# Patient Record
Sex: Male | Born: 1940 | Race: White | Hispanic: No | Smoking: Former smoker
Health system: Southern US, Community
[De-identification: ages and names within clinical notes are randomized; demographics above are authoritative.]

## PROBLEM LIST (undated history)

## (undated) DIAGNOSIS — Z950 Presence of cardiac pacemaker: Secondary | ICD-10-CM

## (undated) DIAGNOSIS — C801 Malignant (primary) neoplasm, unspecified: Secondary | ICD-10-CM

## (undated) DIAGNOSIS — K219 Gastro-esophageal reflux disease without esophagitis: Secondary | ICD-10-CM

---

## 2008-07-06 DIAGNOSIS — C801 Malignant (primary) neoplasm, unspecified: Secondary | ICD-10-CM

## 2008-07-06 HISTORY — DX: Malignant (primary) neoplasm, unspecified: C80.1

## 2009-11-01 ENCOUNTER — Ambulatory Visit: Admission: RE | Admit: 2009-11-01 | Discharge: 2010-01-01 | Payer: Self-pay | Admitting: Radiation Oncology

## 2009-11-08 ENCOUNTER — Ambulatory Visit (HOSPITAL_COMMUNITY): Admission: RE | Admit: 2009-11-08 | Discharge: 2009-11-08 | Payer: Self-pay | Admitting: Radiation Oncology

## 2009-11-13 ENCOUNTER — Encounter: Admission: RE | Admit: 2009-11-13 | Discharge: 2009-11-13 | Payer: Self-pay | Admitting: Dentistry

## 2009-11-13 ENCOUNTER — Ambulatory Visit: Payer: Self-pay | Admitting: Dentistry

## 2010-08-14 ENCOUNTER — Ambulatory Visit: Payer: Medicare HMO | Attending: Radiation Oncology | Admitting: Radiation Oncology

## 2011-03-24 IMAGING — CT CT NECK W/ CM
2 series · 10 of 14 positions shown, 12 images · IV contrast (agent unspecified)
Comparison: CT and Report from previous CT scan Nahum Roca
August 2009.

CLINICAL DATA: Biopsy-proven carcinoma of the left vocal cord.
Staging for XRT.

CT NECK WITH CONTRAST
TECHNIQUE: Multidetector CT imaging of the neck was performed with
intravenous contrast.
Contrast: 100 ml [id number].

[Series 2: neck rtn st · axial · 0.39mm/px · z∈[-282,-178]mm · 6 of 49 slices shown, 8 images (1 of 2)]
[im 7/49  soft-tissue]
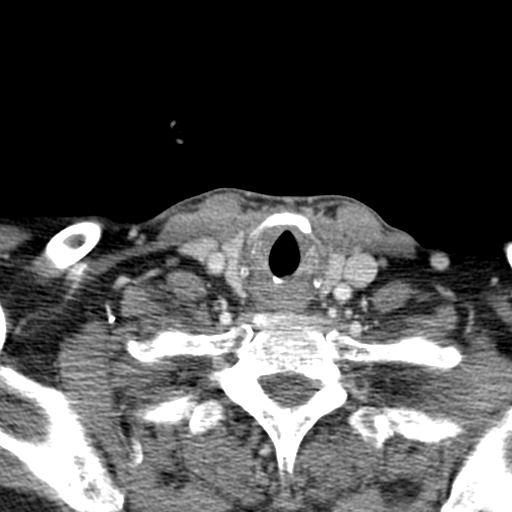
[im 7/49  bone]
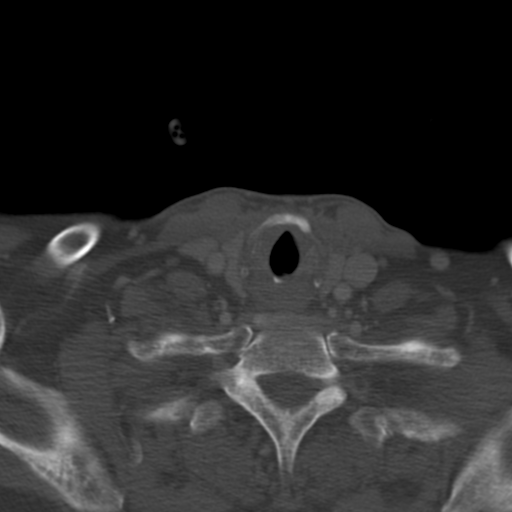
[im 14/49  bone]
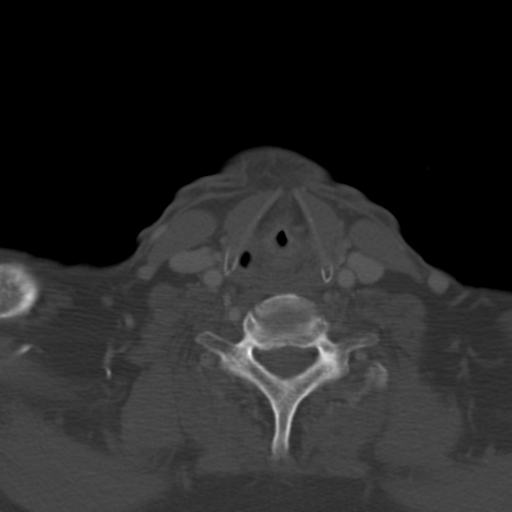
[im 21/49  bone]
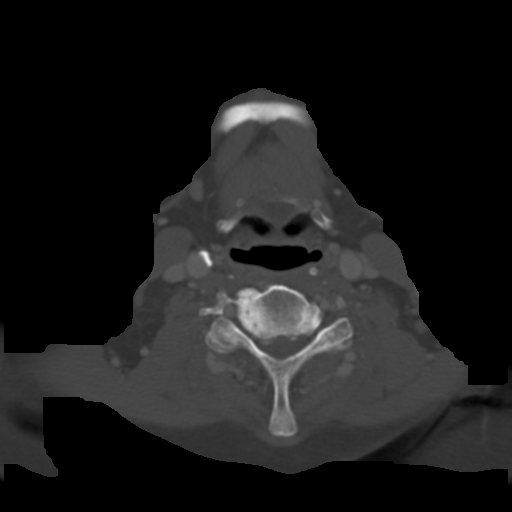
[im 28/49  bone]
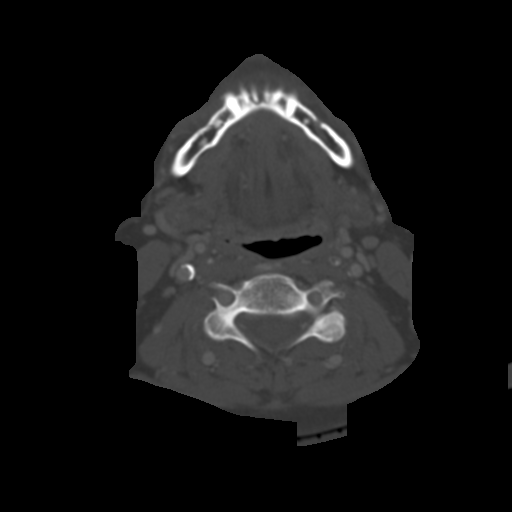
[im 35/49  soft-tissue]
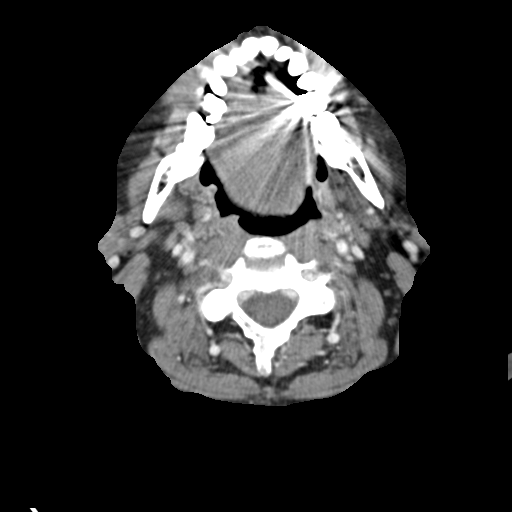
[im 35/49  bone]
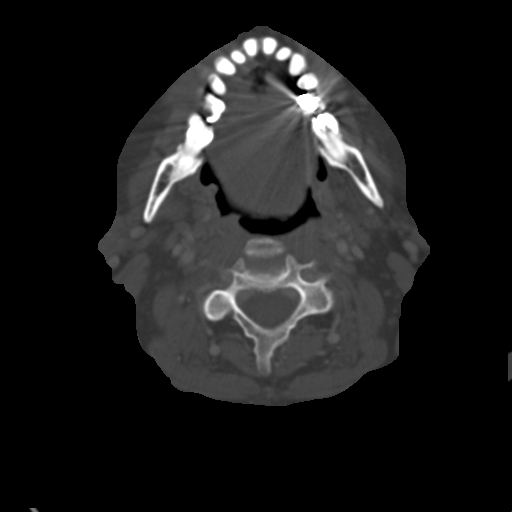
[im 42/49  bone]
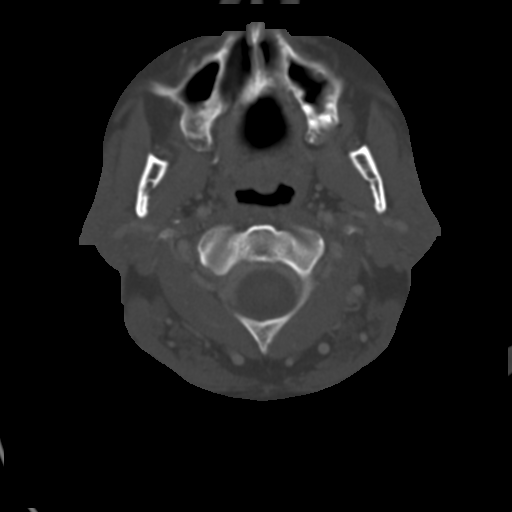

[Series 4: neck rtn st · axial · 0.39mm/px · z∈[-338,-272]mm · 4 of 38 slices shown (2 of 2)]
[im 8/38  bone]
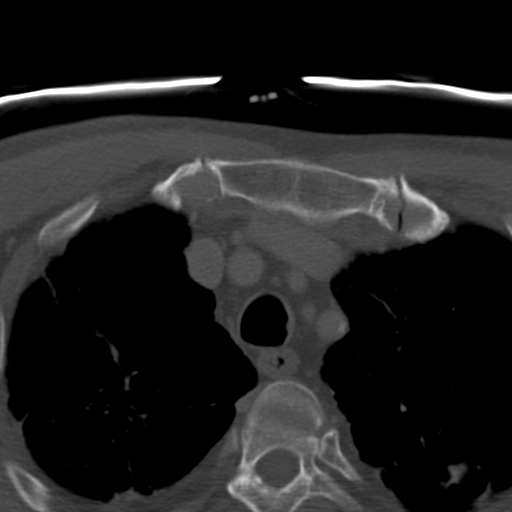
[im 15/38  bone]
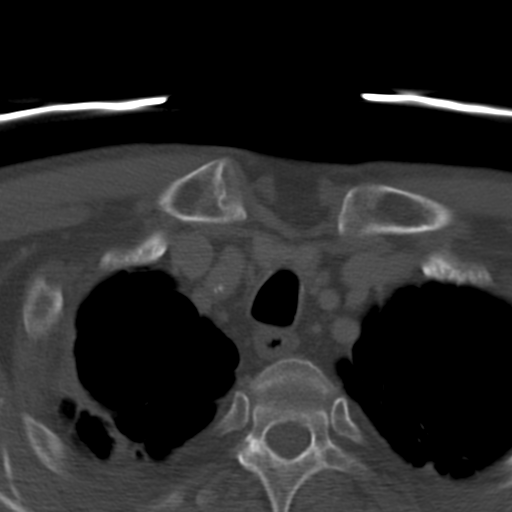
[im 23/38  bone]
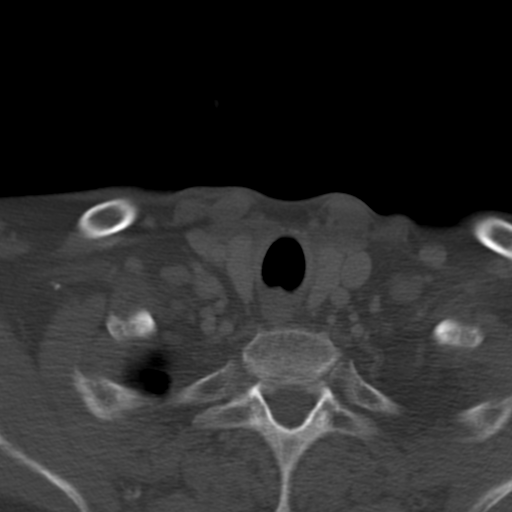
[im 30/38  bone]
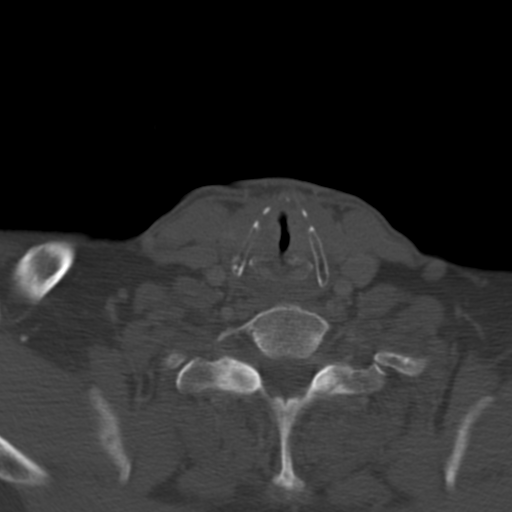

[10 of 14 positions shown; findings below may reference images not displayed]

FINDINGS: Suprahyoid neck:  No masses.  Normal parotid and submandibular
glands.

Larynx:  There is mild fullness and enhancement in the left true
cord as compared to the right. There is no visible involvement of
the anterior commissure.  Thyroid cartilage is intact. Subglottic
region free of disease.

The left arytenoid is sclerotic as compared to the right which
represents an interval change from prior outside CT.  While this
may simply represent paraspinous from inflammation, I cannot
exclude cartilage invasion.  MRI without with contrast may provide
additional information.  PET scan is unlikely to be a wide
sufficient spatial resolution in this area to answer the question
of cartilage invasion.

Infrahyoid neck:  No visible mass.

Lymph nodes:  No pathologically enlarged lymph nodes.

Upper chest/mediastinum:  Biapical bullae, incompletely evaluated.
Mild pleuroparenchymal thickening.

Additional:  Advanced cervical spondylosis, worst at C5-6.
IMPRESSION: Mild left vocal cord fullness with interval change in the
appearance of the left arytenoid which is now sclerotic; posterior
tumor invasion not excluded.  Consider MRI without and with
contrast of the neck for further evaluation.

## 2014-01-23 ENCOUNTER — Inpatient Hospital Stay (HOSPITAL_COMMUNITY): Payer: Medicare HMO | Admitting: Certified Registered Nurse Anesthetist

## 2014-01-23 ENCOUNTER — Observation Stay (HOSPITAL_COMMUNITY)
Admission: AD | Admit: 2014-01-23 | Discharge: 2014-01-24 | Disposition: A | Discharge status: Home / Self Care | Payer: Medicare HMO | Source: Ambulatory Visit | Attending: Cardiology | Admitting: Cardiology

## 2014-01-23 ENCOUNTER — Encounter (HOSPITAL_COMMUNITY): Payer: Self-pay | Admitting: *Deleted

## 2014-01-23 ENCOUNTER — Encounter (HOSPITAL_COMMUNITY)
Admission: AD | Disposition: A | Discharge status: Home / Self Care | Payer: Self-pay | Source: Ambulatory Visit | Attending: Cardiology

## 2014-01-23 ENCOUNTER — Encounter (HOSPITAL_COMMUNITY): Payer: Medicare HMO | Admitting: Certified Registered Nurse Anesthetist

## 2014-01-23 DIAGNOSIS — I079 Rheumatic tricuspid valve disease, unspecified: Secondary | ICD-10-CM | POA: Diagnosis not present

## 2014-01-23 DIAGNOSIS — R5381 Other malaise: Secondary | ICD-10-CM | POA: Insufficient documentation

## 2014-01-23 DIAGNOSIS — I959 Hypotension, unspecified: Secondary | ICD-10-CM | POA: Diagnosis not present

## 2014-01-23 DIAGNOSIS — D7589 Other specified diseases of blood and blood-forming organs: Secondary | ICD-10-CM | POA: Diagnosis not present

## 2014-01-23 DIAGNOSIS — Z8521 Personal history of malignant neoplasm of larynx: Secondary | ICD-10-CM | POA: Diagnosis not present

## 2014-01-23 DIAGNOSIS — I059 Rheumatic mitral valve disease, unspecified: Secondary | ICD-10-CM | POA: Diagnosis not present

## 2014-01-23 DIAGNOSIS — Z923 Personal history of irradiation: Secondary | ICD-10-CM | POA: Insufficient documentation

## 2014-01-23 DIAGNOSIS — R5383 Other fatigue: Secondary | ICD-10-CM | POA: Diagnosis not present

## 2014-01-23 DIAGNOSIS — R911 Solitary pulmonary nodule: Secondary | ICD-10-CM | POA: Diagnosis not present

## 2014-01-23 DIAGNOSIS — Z87891 Personal history of nicotine dependence: Secondary | ICD-10-CM | POA: Insufficient documentation

## 2014-01-23 DIAGNOSIS — K219 Gastro-esophageal reflux disease without esophagitis: Secondary | ICD-10-CM | POA: Insufficient documentation

## 2014-01-23 DIAGNOSIS — I4891 Unspecified atrial fibrillation: Secondary | ICD-10-CM | POA: Diagnosis not present

## 2014-01-23 HISTORY — DX: Gastro-esophageal reflux disease without esophagitis: K21.9

## 2014-01-23 HISTORY — PX: CARDIOVERSION: SHX1299

## 2014-01-23 HISTORY — DX: Malignant (primary) neoplasm, unspecified: C80.1

## 2014-01-23 HISTORY — DX: Presence of cardiac pacemaker: Z95.0

## 2014-01-23 LAB — BASIC METABOLIC PANEL
Anion gap: 11 (ref 5–15)
BUN: 26 mg/dL — ABNORMAL HIGH (ref 6–23)
CO2: 24 mEq/L (ref 19–32)
Calcium: 8 mg/dL — ABNORMAL LOW (ref 8.4–10.5)
Chloride: 101 mEq/L (ref 96–112)
Creatinine, Ser: 1 mg/dL (ref 0.50–1.35)
GFR calc Af Amer: 85 mL/min — ABNORMAL LOW (ref >90)
GFR calc non Af Amer: 73 mL/min — ABNORMAL LOW (ref >90)
GLUCOSE: 91 mg/dL (ref 70–99)
POTASSIUM: 4.8 meq/L (ref 3.7–5.3)
Sodium: 136 mEq/L — ABNORMAL LOW (ref 137–147)

## 2014-01-23 LAB — MRSA PCR SCREENING: MRSA by PCR: NEGATIVE

## 2014-01-23 LAB — CBC WITH DIFFERENTIAL/PLATELET
Basophils Absolute: 0 10*3/uL (ref 0.0–0.1)
Basophils Relative: 0 % (ref 0–1)
EOS ABS: 0 10*3/uL (ref 0.0–0.7)
Eosinophils Relative: 0 % (ref 0–5)
HEMATOCRIT: 28.4 % — AB (ref 39.0–52.0)
HEMOGLOBIN: 9.2 g/dL — AB (ref 13.0–17.0)
LYMPHS ABS: 0.6 10*3/uL — AB (ref 0.7–4.0)
Lymphocytes Relative: 6 % — ABNORMAL LOW (ref 12–46)
MCH: 31.3 pg (ref 26.0–34.0)
MCHC: 32.4 g/dL (ref 30.0–36.0)
MCV: 96.6 fL (ref 78.0–100.0)
MONOS PCT: 7 % (ref 3–12)
Monocytes Absolute: 0.8 10*3/uL (ref 0.1–1.0)
Neutro Abs: 9.5 10*3/uL — ABNORMAL HIGH (ref 1.7–7.7)
Neutrophils Relative %: 87 % — ABNORMAL HIGH (ref 43–77)
Platelets: 289 10*3/uL (ref 150–400)
RBC: 2.94 MIL/uL — ABNORMAL LOW (ref 4.22–5.81)
RDW: 16.8 % — ABNORMAL HIGH (ref 11.5–15.5)
WBC: 11 10*3/uL — ABNORMAL HIGH (ref 4.0–10.5)

## 2014-01-23 LAB — TSH: TSH: 2.19 u[IU]/mL (ref 0.350–4.500)

## 2014-01-23 SURGERY — CARDIOVERSION
Anesthesia: Monitor Anesthesia Care

## 2014-01-23 MED ORDER — SODIUM CHLORIDE 0.9 % IV SOLN
INTRAVENOUS | Status: DC | PRN
  Administered 2014-01-23: 18:00:00 via INTRAVENOUS

## 2014-01-23 MED ORDER — SODIUM CHLORIDE 0.9 % IV SOLN: 250.0000 mL | INTRAVENOUS | Status: DC

## 2014-01-23 MED ORDER — RIVAROXABAN 20 MG PO TABS
20.0000 mg | ORAL_TABLET | Freq: Every day | ORAL | Status: DC
  Administered 2014-01-23: 20 mg via ORAL
  Filled 2014-01-23 (×2): qty 1

## 2014-01-23 MED ORDER — SODIUM CHLORIDE 0.9 % IJ SOLN: 3.0000 mL | Freq: Two times a day (BID) | INTRAMUSCULAR | Status: DC

## 2014-01-23 MED ORDER — PHENYLEPHRINE HCL 10 MG/ML IJ SOLN
INTRAMUSCULAR | Status: DC | PRN
  Administered 2014-01-23: 80 ug via INTRAVENOUS

## 2014-01-23 MED ORDER — SODIUM CHLORIDE 0.9 % IJ SOLN: 3.0000 mL | INTRAMUSCULAR | Status: DC | PRN

## 2014-01-23 MED ORDER — PROPOFOL 10 MG/ML IV BOLUS
INTRAVENOUS | Status: DC | PRN
  Administered 2014-01-23: 50 mg via INTRAVENOUS

## 2014-01-23 MED ORDER — LIDOCAINE HCL (CARDIAC) 20 MG/ML IV SOLN
INTRAVENOUS | Status: DC | PRN
Start: 2014-01-23 — End: 2014-01-23
  Administered 2014-01-23: 60 mg via INTRAVENOUS

## 2014-01-23 NOTE — Transfer of Care (Signed)
Immediate Anesthesia Transfer of Care Note  Patient: Adrian Wade  Procedure(s) Performed: Procedure(s): CARDIOVERSION (N/A)  Patient Location: 2H20 Report to Sol RN  Anesthesia Type:MAC  Level of Consciousness: awake, alert , oriented and patient cooperative  Airway & Oxygen Therapy: Patient Spontanous Breathing and Patient connected to nasal cannula oxygen  Post-op Assessment: Report given to PACU RN, Post -op Vital signs reviewed and stable and Patient moving all extremities X 4  Post vital signs: Reviewed and stable  Complications: No apparent anesthesia complications

## 2014-01-23 NOTE — H&P (Addendum)
Adrian Wade is an 73 y.o. male.   Chief Complaint: Fatigue and dyspnea HPI: Mr. Adrian Wade is a 73 year old Caucasian male presents for the evaluation of  atrial fibrillation.  Patient states that he is feeling fatigue and tired since February 2015.  He felt like he has no energy and some days are worse than others.  He went to primary care provider for fatigue a week ago and was diagnosed with A. fib. with RVR. Patient was started on metoprolol, Diltiazem and Xarelto in May of 2015. He was seen on our office and I am admitting him directly to the hospital due to A. FIb with RVR   I had performed an echocardiogram, stress test and also nocturnal oximetry due to marked fatigue.  He also were event monitor to confirm presence of atrial fibrillation, he also recently has had CT scan of the chest and  2 days ago he was told to have a lung lesion that appears to be suspicious for malignancy, patient is being treated with antibiotics for the same.  Patient has had a laryngeal cancer in 2010.  Patient is in remission status po radiation.  Present had a history of smoking, one pack per day for 50 years, quit in 2009. No recent weight changes. No bleeding diathesis, no bowel or bladder disturbances.  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 CHA2DS2-VASc Score is 1 with yearly risk of stroke of 1.3 . Bleeding risk is 1.02-1.5 % per year.  Labs 01/01/2014: CMP was normal except for decreased serum albumin at 2.9. CBC was within normal limits, mild macrocytosis was evident. MCV 101. Vitamin B12 274, normal. TSH normal in January 2015. Malaise and fatigue (780.79). Nocturnal oximetry 01/19/2014: SPO2 less than 88% 495 minutes, less than 89% for 95 minutes. Lowest SPO2 71%. Basal SPO2 81%. Total desaturation events 49. Markedly abnormal nocturnal oximetry. Patient will be referred for sleep study. 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 Social History:  Marital status. Divorced. Alcohol Use. Occasional alcohol use, Drinks beer. Current tobacco use. Former smoker. quit in 2009, smoked about 1 ppd Number of Children. 1.  Allergies: NKDA  No prescriptions prior to admission      Review of Systems - General:Present- Fatigue. Not Present- Fever and Night Sweats. Skin:Not Present- Itching and Rash. HEENT:Not Present- Headache. Respiratory:Present- Chronic Cough. Not Present- Bloody sputum, Wheezing and Wakes up from Sleep Wheezing or Short of Breath. Cardiovascular:Not Present- Claudications, Fainting, Orthopnea and Swelling of Extremities. Gastrointestinal:Not Present- Abdominal Pain, Constipation, Diarrhea, Nausea and Vomiting. Musculoskeletal:Present- Backache (chronic and stable). Not Present- Joint Swelling. Neurological:Not Present- Headaches. Hematology:Not Present- Blood Clots, Easy Bruising and Nose Bleed.  Blood pressure 83/57, pulse 149, temperature 97.7 F (36.5 C), temperature source Oral, resp. rate 27, height 5' 10.5" (1.791 m), weight 57.8 kg (127 lb 6.8 oz), SpO2 92.00%. GENERAL: Well built and petite body habitus, who is in no acute distress. Alert and oriented x 3. Appears stated age.   HEENT: normal limits. No JVD.   CARDIAC EXAM: S1 variable and irregular, S2 normal, tachycardia present. No murmur or gallop.   CHEST EXAM: No tenderness of chest wall. LUNGS: Clear to percuss and auscultate. No rales or ronchi.   ABDOMEN: No hepatosplenomegaly. BS normal in all 4 quadrants. Abdomen is non-tender.   EXTREMITY: Full range of movementes, No edema. fUNGAL INFECTION OF ALL THE TOE NAILS NOTED.  NEUROLOGIC  EXAM: Grossly intact without any focal deficits. Alert O x 3.   VASCULAR EXAM: No skin breakdown. Carotids normal. Extremities: Femoral pulse normal. Popliteal pulse normal ;  Pedal pulse DP left 1 plus, otherwise normal vascular exam. No prominent pulse felt in the abdomen. No varicose veins.   EKG: atrial fibrillation with rapid ventricular response at the rate of 140-170 bpm.  No ischemia..   Assessment/Plan 1. Paroxysmal atrial fibrillation   CHA2DS2-VASc Score is 1 with yearly risk of stroke of 1.3 . Bleeding risk is 1.02-1.5 % per year.  Out Patient studies   Exercise myoview stress 12/08/2013: 1. Resting EKG showed normal sinus rhythm, poor R wave progression. Stress EKG was negative for ischemia. Patient exercised on BRUCE PROTOCOL for 3 minutes 0 seconds. The maximum work level achieved was 4.9 MET's. Accelerated HR response suggests aerobic intolerance. Hypotensive BP response. The baseline blood pressure was 122/74 mmHg and 80/50 mmHg with exercise. The test was terminated due to achievement of the target heart rate. 2. Perfusion imaging study demonstrates gut uptake artifact without evidence of ischemia or scar. The left ventricular ejection function was normal. This represents a low risk study. This is a low risk study. Clinical correlation recommended.  Echo- 12/28/13 1. Left ventricle cavity is normal in size. Mild decrease in global wall motion. Mildly depressed syst. function, calculated EF- 50%. 2. Left atrial cavity is mildly dilated. 3. Mild mitral regurgitation. Mild prolapse of the mitral valve leaflets. 4. Mild tricuspid regurgitation. Insignificant pericardial effusion  Labs 01/01/2014: CMP was normal, decreased serum albumin at 2.9. CBC normal limits, mild macrocytosis was evident. MCV 101. Vitamin B-12 274, normal. TSH normal in January 2015. Impression: EKG 01/23/2014: Atrial fibrillation with rapid ventricular response, ventricular rate of 150-190 bpm, poor R-wave progression, no evidence of ischemia.  Event Monitor 01/03/2014: Persistent A. Fib with RVR.  EKG 11/29/2013: Wandering atrial pacemaker, ventricular rate of 85 bpm, normal axis,  poor R-wave progression.  EKG 11/23/2013: Atrial fibrillation with rapid ventricular response. Current Plans  2. Lung nodule Left lung nodule by CT 01/18/2014.  3. Malaise and fatigue  Nocturnal oximetry 01/19/2014: SPO2 less than 88% 495 minutes, less than 89% for 95 minutes. Lowest SPO2 71%. Basal SPO2 81%. Total desaturation events 49. Markedly abnormal nocturnal oximetry. Patient will be referred for sleep study.  4. iHstory of primary laryngeal cancer  S/P Ratiation therapy 10 years ago or so.  Recommendation: Patient with rapid ventricular response, marked fatigue, already on to negative chronotropic agents, we will admit the patient to the hospital, patient has not eaten since last evening, I will attempt to perform cardioversion today if scheduling permits, patient has been on long-term anticoagulation greater than 4 weeks.  I'll make further recommendations in the hospital. BP very soft and unable to titrate the medications as OP. Hence need for urgent cardioversion. Patient admitted to ICU/step-down.  Laverda Page, MD 01/23/2014, 6:00 PM [facility] [place] Pager: [phone] Office: [phone] If no answer: Cell:  [phone]

## 2014-01-23 NOTE — Anesthesia Procedure Notes (Signed)
Procedure Name: MAC Date/Time: 01/23/2014 6:18 PM Performed by: Ned Grace Pre-anesthesia Checklist: Patient identified, Emergency Drugs available, Suction available, Patient being monitored and Timeout performed Patient Re-evaluated:Patient Re-evaluated prior to inductionOxygen Delivery Method: Ambu bag Preoxygenation: Pre-oxygenation with 100% oxygen Intubation Type: IV induction Ventilation: Mask ventilation without difficulty

## 2014-01-23 NOTE — Anesthesia Postprocedure Evaluation (Signed)
  Anesthesia Post-op Note  Patient: Adrian Wade  Procedure(s) Performed: Procedure(s): CARDIOVERSION (N/A)  Patient Location: ICU  Anesthesia Type:General  Level of Consciousness: awake, alert , oriented and patient cooperative  Airway and Oxygen Therapy: Patient Spontanous Breathing and Patient connected to nasal cannula oxygen  Post-op Pain: none  Post-op Assessment: Post-op Vital signs reviewed, Patient's Cardiovascular Status Stable, Respiratory Function Stable, Patent Airway, No signs of Nausea or vomiting and Pain level controlled  Post-op Vital Signs: Reviewed and stable  Last Vitals:  Filed Vitals:   01/23/14 1815  BP:   Pulse:   Temp:   Resp: 25    Complications: No apparent anesthesia complications

## 2014-01-23 NOTE — Interval H&P Note (Signed)
History and Physical Interval Note:  01/23/2014 6:29 PM  Adrian Wade  has presented today for surgery, with the diagnosis of Dysrhythmia  The various methods of treatment have been discussed with the patient and family. After consideration of risks, benefits and other options for treatment, the patient has consented to  Procedure(s): CARDIOVERSION (N/A) as a surgical intervention .  The patient's history has been reviewed, patient examined, no change in status, stable for surgery.  I have reviewed the patient's chart and labs.  Questions were answered to the patient's satisfaction.     Laverda Page

## 2014-01-23 NOTE — CV Procedure (Signed)
Direct current cardioversion:  Indication symptomatic A. Fibrillation. Hypotension  Procedure: Using 50 mg of IV Propofol and 60 IV Lidocaine (for reducing venous pain) for achieving deep sedation, synchronized direct current cardioversion performed. Patient was delivered with 100 Joules of electricity X 1 with success to NSR. Patient tolerated the procedure well. No immediate complication noted.

## 2014-01-23 NOTE — Anesthesia Preprocedure Evaluation (Signed)
Anesthesia Evaluation  Patient identified by MRN, date of birth, ID band Patient awake    Reviewed: Allergy & Precautions, NPO status , Patient's Chart, lab work & pertinent test results  Airway Mallampati: II  Neck ROM: full    Dental   Pulmonary          Cardiovascular + dysrhythmias Atrial Fibrillation Rhythm:irregular Rate:Tachycardia  Unstable rapid AF   Neuro/Psych    GI/Hepatic   Endo/Other    Renal/GU      Musculoskeletal   Abdominal   Peds  Hematology   Anesthesia Other Findings   Reproductive/Obstetrics                           Anesthesia Physical Anesthesia Plan  ASA: III and emergent  Anesthesia Plan: MAC   Post-op Pain Management:    Induction: Intravenous  Airway Management Planned: Mask  Additional Equipment:   Intra-op Plan:   Post-operative Plan:   Informed Consent: I have reviewed the patients History and Physical, chart, labs and discussed the procedure including the risks, benefits and alternatives for the proposed anesthesia with the patient or authorized representative who has indicated his/her understanding and acceptance.     Plan Discussed with: CRNA and Anesthesiologist  Anesthesia Plan Comments:         Anesthesia Quick Evaluation

## 2014-01-24 ENCOUNTER — Encounter (HOSPITAL_COMMUNITY): Payer: Self-pay | Admitting: Cardiology

## 2014-01-24 DIAGNOSIS — I4891 Unspecified atrial fibrillation: Secondary | ICD-10-CM | POA: Diagnosis not present

## 2014-01-24 DIAGNOSIS — R5381 Other malaise: Secondary | ICD-10-CM | POA: Diagnosis not present

## 2014-01-24 DIAGNOSIS — R911 Solitary pulmonary nodule: Secondary | ICD-10-CM | POA: Diagnosis not present

## 2014-01-24 DIAGNOSIS — R5383 Other fatigue: Secondary | ICD-10-CM | POA: Diagnosis not present

## 2014-01-24 DIAGNOSIS — I959 Hypotension, unspecified: Secondary | ICD-10-CM | POA: Diagnosis not present

## 2014-01-24 MED ORDER — SOTALOL HCL 80 MG PO TABS
80.0000 mg | ORAL_TABLET | Freq: Two times a day (BID) | ORAL | Status: DC
  Administered 2014-01-24: 80 mg via ORAL
  Filled 2014-01-24 (×3): qty 1

## 2014-01-24 MED ORDER — DILTIAZEM HCL ER COATED BEADS 120 MG PO CP24: 120.0000 mg | ORAL_CAPSULE | Freq: Every day | ORAL | Status: AC

## 2014-01-24 MED ORDER — DILTIAZEM HCL ER COATED BEADS 120 MG PO CP24
120.0000 mg | ORAL_CAPSULE | Freq: Every day | ORAL | Status: DC
  Administered 2014-01-24: 120 mg via ORAL
  Filled 2014-01-24: qty 1

## 2014-01-24 MED ORDER — DOXYCYCLINE HYCLATE 100 MG PO TABS
100.0000 mg | ORAL_TABLET | Freq: Two times a day (BID) | ORAL | Status: DC
  Administered 2014-01-24 (×2): 100 mg via ORAL
  Filled 2014-01-24 (×3): qty 1

## 2014-01-24 MED ORDER — SOTALOL HCL 80 MG PO TABS: 80.0000 mg | ORAL_TABLET | Freq: Two times a day (BID) | ORAL | Status: AC

## 2014-01-24 MED ORDER — PANTOPRAZOLE SODIUM 40 MG PO TBEC
40.0000 mg | DELAYED_RELEASE_TABLET | Freq: Every day | ORAL | Status: DC
  Administered 2014-01-24: 40 mg via ORAL
  Filled 2014-01-24: qty 1

## 2014-01-24 NOTE — Significant Event (Signed)
MD please note that patient has hemoptysis x one month, was started on Xarelto approximately one month ago, patient states MD is aware, Hazle Nordmann RN

## 2014-01-24 NOTE — Discharge Summary (Signed)
Physician Discharge Summary  Patient ID: Adrian Wade MRN: [id number] DOB/AGE: 73/06/1941 73 y.o.  Admit date: 01/23/2014 Discharge date: 01/24/2014  Primary Discharge Diagnosis A. Fib with RVR- Symptomatic with hypotension. CHA2DS2-VASc Score is 1 with yearly risk of stroke of 1.3 . Bleeding risk is 1.02-1.5 % per year.  Secondary Discharge Diagnosis 1COPD 2. Lung nodule Left lung nodule by CT 01/18/2014. Stated to occupy complete left lower lobe per his PCP  3. Malaise and fatigue  Nocturnal oximetry 01/19/2014: SPO2 less than 88% 495 minutes, less than 89% for 95 minutes. Lowest SPO2 71%. Basal SPO2 81%. Total desaturation events 49. Markedly abnormal nocturnal oximetry. 4. History of primary laryngeal cancer S/P Ratiation therapy 10 years ago or so.  Hospital Course: Patient admitted from OP visit due to hypotension and fatigue. Underwent urgent DCCV with success to  Converting to sinus.  Started on Sotolol to maintain sinus. Patient has appointment to see Oncology service at [facility] (has been seeing them for f/u for laryngeal cancer. D/W his PCP, Ms. Eldridge Abrahams, NP-C Continue Xarelto for now and can be discontinued if felt necessary by oncology.  Procedures: 01/23/2014: Cardioversion: Using 50 mg of IV Propofol and 60 IV Lidocaine (for reducing venous pain) for achieving deep sedation, synchronized direct current cardioversion performed. Patient was delivered with 100 Joules of electricity X 1 with success to NSR. Patient tolerated the procedure well. No immediate complication noted.    Recommendations on discharge: Stopped Metoprolol, Switched to Sotalol. Decreased Cardizem from 240 mg to 120 mg. Home today with OP f/u. Feels well this mornig. BP stable.   Discharge Exam: Blood pressure 99/65, pulse 109, temperature 98.8 F (37.1 C), temperature source Oral, resp. rate 19, height 5' 10.5" (1.791 m), weight 57.8 kg (127 lb 6.8 oz), SpO2 97.00%.  HEENT: normal limits. No JVD.  CARDIAC  EXAM: S1 S2 normal. No murmur or gallop.  CHEST EXAM: Barrel shaped chest.  wall. LUNGS: Clear to percuss and auscultate. No rales or ronchi.  ABDOMEN: No hepatosplenomegaly. BS normal in all 4 quadrants. Abdomen is non-tender.  EXTREMITY: Full range of movementes, No edema.  NEUROLOGIC EXAM: Grossly intact without any focal deficits. Alert O x 3.    Labs:   Lab Results  Component Value Date   WBC 11.0* 01/23/2014   HGB 9.2* 01/23/2014   HCT 28.4* 01/23/2014   MCV 96.6 01/23/2014   PLT 289 01/23/2014    Recent Labs Lab 01/23/14 1910  NA 136*  K 4.8  CL 101  CO2 24  BUN 26*  CREATININE 1.00  CALCIUM 8.0*  GLUCOSE 91    EKG: NSR, PAC    Radiology: No results found.    FOLLOW UP PLANS AND APPOINTMENTS    Medication List    STOP taking these medications       diltiazem 240 MG 24 hr capsule  Commonly known as:  DILACOR XR  Replaced by:  diltiazem 120 MG 24 hr capsule     metoprolol tartrate 25 MG tablet  Commonly known as:  LOPRESSOR      TAKE these medications       diltiazem 120 MG 24 hr capsule  Commonly known as:  CARDIZEM CD  Take 1 capsule (120 mg total) by mouth daily.     doxycycline 100 MG tablet  Commonly known as:  VIBRA-TABS  Take 100 mg by mouth 2 (two) times daily.     omeprazole 20 MG capsule  Commonly known as:  PRILOSEC  Take 20  mg by mouth daily.     rivaroxaban 20 MG Tabs tablet  Commonly known as:  XARELTO  Take 20 mg by mouth daily with supper.     sotalol 80 MG tablet  Commonly known as:  BETAPACE  Take 1 tablet (80 mg total) by mouth every 12 (twelve) hours.           Follow-up Information   Follow up with Laverda Page, MD On 02/09/2014. (1:15 pm)    Specialty:  Cardiology   Contact information:   [place] [place] [address] [phone]       Schedule an appointment as soon as possible for a visit with Berkley Harvey, NP. (To f/u on lung mass)    Specialty:  Nurse Practitioner   Contact  information:   [address] [place] [address] [phone]        Laverda Page, MD 01/24/2014, 9:00 AM  Pager: [phone] Office: [phone] If no answer: [phone]

## 2014-01-24 NOTE — Progress Notes (Signed)
Discharge instructions and med reconc reviewed with patient and daughter, verbalizes understanding via teachback, IV discontinued and monitor removed, transported to private vehicle by hospital volunteer to private vehicle to home, Hazle Nordmann RN

## 2014-02-21 ENCOUNTER — Institutional Professional Consult (permissible substitution): Payer: Medicare HMO | Admitting: Pulmonary Disease

## 2014-03-06 DEATH — deceased
